# Patient Record
Sex: Male | Born: 1995 | Race: White | Hispanic: No | Marital: Single | Smoking: Never smoker
Health system: Southern US, Community
[De-identification: ages and names within clinical notes are randomized; demographics above are authoritative.]

## PROBLEM LIST (undated history)

## (undated) DIAGNOSIS — Z9109 Other allergy status, other than to drugs and biological substances: Secondary | ICD-10-CM

## (undated) DIAGNOSIS — S42302A Unspecified fracture of shaft of humerus, left arm, initial encounter for closed fracture: Secondary | ICD-10-CM

## (undated) DIAGNOSIS — L80 Vitiligo: Secondary | ICD-10-CM

## (undated) DIAGNOSIS — S82402A Unspecified fracture of shaft of left fibula, initial encounter for closed fracture: Secondary | ICD-10-CM

## (undated) DIAGNOSIS — R278 Other lack of coordination: Secondary | ICD-10-CM

## (undated) DIAGNOSIS — S42002A Fracture of unspecified part of left clavicle, initial encounter for closed fracture: Secondary | ICD-10-CM

## (undated) DIAGNOSIS — S86009A Unspecified injury of unspecified Achilles tendon, initial encounter: Secondary | ICD-10-CM

## (undated) DIAGNOSIS — R488 Other symbolic dysfunctions: Secondary | ICD-10-CM

## (undated) HISTORY — DX: Unspecified fracture of shaft of left fibula, initial encounter for closed fracture: S82.402A

## (undated) HISTORY — DX: Other lack of coordination: R27.8

## (undated) HISTORY — DX: Other symbolic dysfunctions: R48.8

## (undated) HISTORY — DX: Unspecified injury of unspecified achilles tendon, initial encounter: S86.009A

## (undated) HISTORY — PX: OTHER SURGICAL HISTORY: SHX169

## (undated) HISTORY — DX: Fracture of unspecified part of left clavicle, initial encounter for closed fracture: S42.002A

## (undated) HISTORY — DX: Other allergy status, other than to drugs and biological substances: Z91.09

## (undated) HISTORY — DX: Vitiligo: L80

## (undated) HISTORY — DX: Unspecified fracture of shaft of humerus, left arm, initial encounter for closed fracture: S42.302A

---
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
## 2017-09-11 DIAGNOSIS — B070 Plantar wart: Secondary | ICD-10-CM | POA: Diagnosis not present

## 2017-10-18 DIAGNOSIS — L723 Sebaceous cyst: Secondary | ICD-10-CM | POA: Diagnosis not present

## 2017-11-07 DIAGNOSIS — B078 Other viral warts: Secondary | ICD-10-CM | POA: Diagnosis not present

## 2017-11-24 ENCOUNTER — Telehealth: Payer: Self-pay | Admitting: Family Medicine

## 2017-11-24 NOTE — Telephone Encounter (Signed)
Okay with me  thanks 

## 2017-11-24 NOTE — Telephone Encounter (Signed)
Copied from CRM [id number]. Topic: Appointment Scheduling - Scheduling Inquiry for Clinic >> Nov 24, 2017  3:28 PM Harlan StainsWilliams-Neal, Sade R wrote: Pt is wanting to Transfer to Dr [facility]

## 2018-03-23 DIAGNOSIS — M24419 Recurrent dislocation, unspecified shoulder: Secondary | ICD-10-CM | POA: Diagnosis not present

## 2018-03-23 DIAGNOSIS — M25511 Pain in right shoulder: Secondary | ICD-10-CM | POA: Diagnosis not present

## 2018-03-26 ENCOUNTER — Encounter: Payer: Self-pay | Admitting: Family Medicine

## 2018-03-26 ENCOUNTER — Ambulatory Visit (INDEPENDENT_AMBULATORY_CARE_PROVIDER_SITE_OTHER): Payer: BLUE CROSS/BLUE SHIELD | Admitting: Family Medicine

## 2018-03-26 VITALS — BP 108/82 | HR 64 | Temp 98.2°F | Ht 74.0 in | Wt 184.2 lb

## 2018-03-26 DIAGNOSIS — L80 Vitiligo: Secondary | ICD-10-CM

## 2018-03-26 DIAGNOSIS — Z13220 Encounter for screening for lipoid disorders: Secondary | ICD-10-CM | POA: Diagnosis not present

## 2018-03-26 DIAGNOSIS — Z0000 Encounter for general adult medical examination without abnormal findings: Secondary | ICD-10-CM

## 2018-03-26 DIAGNOSIS — M25511 Pain in right shoulder: Secondary | ICD-10-CM

## 2018-03-26 DIAGNOSIS — R278 Other lack of coordination: Secondary | ICD-10-CM | POA: Insufficient documentation

## 2018-03-26 DIAGNOSIS — J301 Allergic rhinitis due to pollen: Secondary | ICD-10-CM

## 2018-03-26 LAB — COMPREHENSIVE METABOLIC PANEL
ALBUMIN: 4.7 g/dL (ref 3.5–5.2)
ALT: 15 U/L (ref 0–53)
AST: 16 U/L (ref 0–37)
Alkaline Phosphatase: 40 U/L (ref 39–117)
BUN: 15 mg/dL (ref 6–23)
CO2: 28 mEq/L (ref 19–32)
Calcium: 9.7 mg/dL (ref 8.4–10.5)
Chloride: 104 mEq/L (ref 96–112)
Creatinine, Ser: 1.07 mg/dL (ref 0.40–1.50)
GFR: 92.81 mL/min (ref >60.00)
Glucose, Bld: 91 mg/dL (ref 70–99)
POTASSIUM: 4.2 meq/L (ref 3.5–5.1)
Sodium: 139 mEq/L (ref 135–145)
Total Bilirubin: 1.1 mg/dL (ref 0.2–1.2)
Total Protein: 6.7 g/dL (ref 6.0–8.3)

## 2018-03-26 LAB — CBC
HEMATOCRIT: 45.6 % (ref 39.0–52.0)
HEMOGLOBIN: 15.7 g/dL (ref 13.0–17.0)
MCHC: 34.4 g/dL (ref 30.0–36.0)
MCV: 88.8 fl (ref 78.0–100.0)
Platelets: 198 10*3/uL (ref 150.0–400.0)
RBC: 5.14 Mil/uL (ref 4.22–5.81)
RDW: 12.8 % (ref 11.5–14.6)
WBC: 5.2 10*3/uL (ref 4.5–10.5)

## 2018-03-26 LAB — LIPID PANEL
CHOLESTEROL: 211 mg/dL — AB (ref 0–200)
HDL: 59.7 mg/dL (ref >39.00)
LDL Cholesterol: 130 mg/dL — ABNORMAL HIGH (ref 0–99)
NonHDL: 151.13
Total CHOL/HDL Ratio: 4
Triglycerides: 108 mg/dL (ref 0.0–149.0)
VLDL: 21.6 mg/dL (ref 0.0–40.0)
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
## 2018-03-26 NOTE — Patient Instructions (Addendum)
Please stop by lab before you go  No changes today- you are doing great!

## 2018-03-30 DIAGNOSIS — M25511 Pain in right shoulder: Secondary | ICD-10-CM | POA: Diagnosis not present

## 2018-04-09 DIAGNOSIS — M25511 Pain in right shoulder: Secondary | ICD-10-CM | POA: Diagnosis not present

## 2018-04-13 DIAGNOSIS — M25311 Other instability, right shoulder: Secondary | ICD-10-CM | POA: Diagnosis not present

## 2018-04-13 DIAGNOSIS — M25511 Pain in right shoulder: Secondary | ICD-10-CM | POA: Diagnosis not present

## 2018-04-16 DIAGNOSIS — M25511 Pain in right shoulder: Secondary | ICD-10-CM | POA: Diagnosis not present

## 2018-04-16 DIAGNOSIS — M25311 Other instability, right shoulder: Secondary | ICD-10-CM | POA: Diagnosis not present

## 2018-04-19 DIAGNOSIS — M25311 Other instability, right shoulder: Secondary | ICD-10-CM | POA: Diagnosis not present

## 2018-04-19 DIAGNOSIS — M25511 Pain in right shoulder: Secondary | ICD-10-CM | POA: Diagnosis not present

## 2018-09-10 DIAGNOSIS — M25311 Other instability, right shoulder: Secondary | ICD-10-CM | POA: Diagnosis not present

## 2018-09-10 DIAGNOSIS — M25511 Pain in right shoulder: Secondary | ICD-10-CM | POA: Diagnosis not present

## 2018-09-18 ENCOUNTER — Encounter: Payer: Self-pay | Admitting: Physical Therapy

## 2018-09-19 DIAGNOSIS — M25511 Pain in right shoulder: Secondary | ICD-10-CM | POA: Diagnosis not present

## 2018-09-19 DIAGNOSIS — M25311 Other instability, right shoulder: Secondary | ICD-10-CM | POA: Diagnosis not present

## 2018-09-21 DIAGNOSIS — I9789 Other postprocedural complications and disorders of the circulatory system, not elsewhere classified: Secondary | ICD-10-CM | POA: Diagnosis not present

## 2018-09-21 DIAGNOSIS — M25511 Pain in right shoulder: Secondary | ICD-10-CM | POA: Diagnosis not present

## 2018-09-21 DIAGNOSIS — Z4889 Encounter for other specified surgical aftercare: Secondary | ICD-10-CM | POA: Diagnosis not present

## 2018-09-21 DIAGNOSIS — G8918 Other acute postprocedural pain: Secondary | ICD-10-CM | POA: Diagnosis not present

## 2018-09-21 DIAGNOSIS — M24811 Other specific joint derangements of right shoulder, not elsewhere classified: Secondary | ICD-10-CM | POA: Diagnosis not present

## 2018-09-21 DIAGNOSIS — M25311 Other instability, right shoulder: Secondary | ICD-10-CM | POA: Diagnosis not present

## 2018-09-21 HISTORY — PX: SHOULDER SURGERY: SHX246

## 2018-09-22 DIAGNOSIS — I9789 Other postprocedural complications and disorders of the circulatory system, not elsewhere classified: Secondary | ICD-10-CM | POA: Diagnosis not present

## 2018-09-22 DIAGNOSIS — Z4889 Encounter for other specified surgical aftercare: Secondary | ICD-10-CM | POA: Diagnosis not present

## 2018-09-22 DIAGNOSIS — M25311 Other instability, right shoulder: Secondary | ICD-10-CM | POA: Diagnosis not present

## 2018-09-23 DIAGNOSIS — Z4889 Encounter for other specified surgical aftercare: Secondary | ICD-10-CM | POA: Diagnosis not present

## 2018-09-23 DIAGNOSIS — I9789 Other postprocedural complications and disorders of the circulatory system, not elsewhere classified: Secondary | ICD-10-CM | POA: Diagnosis not present

## 2018-09-23 DIAGNOSIS — M25311 Other instability, right shoulder: Secondary | ICD-10-CM | POA: Diagnosis not present

## 2018-09-24 DIAGNOSIS — Z4889 Encounter for other specified surgical aftercare: Secondary | ICD-10-CM | POA: Diagnosis not present

## 2018-09-24 DIAGNOSIS — I9789 Other postprocedural complications and disorders of the circulatory system, not elsewhere classified: Secondary | ICD-10-CM | POA: Diagnosis not present

## 2018-09-24 DIAGNOSIS — M25311 Other instability, right shoulder: Secondary | ICD-10-CM | POA: Diagnosis not present

## 2018-09-25 DIAGNOSIS — Z4889 Encounter for other specified surgical aftercare: Secondary | ICD-10-CM | POA: Diagnosis not present

## 2018-09-25 DIAGNOSIS — I9789 Other postprocedural complications and disorders of the circulatory system, not elsewhere classified: Secondary | ICD-10-CM | POA: Diagnosis not present

## 2018-09-25 DIAGNOSIS — M25311 Other instability, right shoulder: Secondary | ICD-10-CM | POA: Diagnosis not present

## 2018-09-26 DIAGNOSIS — Z4889 Encounter for other specified surgical aftercare: Secondary | ICD-10-CM | POA: Diagnosis not present

## 2018-09-26 DIAGNOSIS — I9789 Other postprocedural complications and disorders of the circulatory system, not elsewhere classified: Secondary | ICD-10-CM | POA: Diagnosis not present

## 2018-09-26 DIAGNOSIS — M25311 Other instability, right shoulder: Secondary | ICD-10-CM | POA: Diagnosis not present

## 2018-09-27 DIAGNOSIS — M25311 Other instability, right shoulder: Secondary | ICD-10-CM | POA: Diagnosis not present

## 2018-09-27 DIAGNOSIS — Z4889 Encounter for other specified surgical aftercare: Secondary | ICD-10-CM | POA: Diagnosis not present

## 2018-09-27 DIAGNOSIS — I9789 Other postprocedural complications and disorders of the circulatory system, not elsewhere classified: Secondary | ICD-10-CM | POA: Diagnosis not present

## 2018-09-28 DIAGNOSIS — I9789 Other postprocedural complications and disorders of the circulatory system, not elsewhere classified: Secondary | ICD-10-CM | POA: Diagnosis not present

## 2018-09-28 DIAGNOSIS — M25311 Other instability, right shoulder: Secondary | ICD-10-CM | POA: Diagnosis not present

## 2018-09-28 DIAGNOSIS — Z4889 Encounter for other specified surgical aftercare: Secondary | ICD-10-CM | POA: Diagnosis not present

## 2018-09-29 DIAGNOSIS — Z4889 Encounter for other specified surgical aftercare: Secondary | ICD-10-CM | POA: Diagnosis not present

## 2018-09-29 DIAGNOSIS — M25311 Other instability, right shoulder: Secondary | ICD-10-CM | POA: Diagnosis not present

## 2018-09-29 DIAGNOSIS — I9789 Other postprocedural complications and disorders of the circulatory system, not elsewhere classified: Secondary | ICD-10-CM | POA: Diagnosis not present

## 2018-09-30 DIAGNOSIS — M25311 Other instability, right shoulder: Secondary | ICD-10-CM | POA: Diagnosis not present

## 2018-09-30 DIAGNOSIS — I9789 Other postprocedural complications and disorders of the circulatory system, not elsewhere classified: Secondary | ICD-10-CM | POA: Diagnosis not present

## 2018-09-30 DIAGNOSIS — Z4889 Encounter for other specified surgical aftercare: Secondary | ICD-10-CM | POA: Diagnosis not present

## 2018-10-01 DIAGNOSIS — M25311 Other instability, right shoulder: Secondary | ICD-10-CM | POA: Diagnosis not present

## 2018-10-01 DIAGNOSIS — Z4889 Encounter for other specified surgical aftercare: Secondary | ICD-10-CM | POA: Diagnosis not present

## 2018-10-01 DIAGNOSIS — I9789 Other postprocedural complications and disorders of the circulatory system, not elsewhere classified: Secondary | ICD-10-CM | POA: Diagnosis not present

## 2018-10-02 DIAGNOSIS — M25311 Other instability, right shoulder: Secondary | ICD-10-CM | POA: Diagnosis not present

## 2018-10-02 DIAGNOSIS — I9789 Other postprocedural complications and disorders of the circulatory system, not elsewhere classified: Secondary | ICD-10-CM | POA: Diagnosis not present

## 2018-10-02 DIAGNOSIS — Z4889 Encounter for other specified surgical aftercare: Secondary | ICD-10-CM | POA: Diagnosis not present

## 2018-10-03 DIAGNOSIS — Z4889 Encounter for other specified surgical aftercare: Secondary | ICD-10-CM | POA: Diagnosis not present

## 2018-10-03 DIAGNOSIS — M25311 Other instability, right shoulder: Secondary | ICD-10-CM | POA: Diagnosis not present

## 2018-10-03 DIAGNOSIS — I9789 Other postprocedural complications and disorders of the circulatory system, not elsewhere classified: Secondary | ICD-10-CM | POA: Diagnosis not present

## 2018-10-04 DIAGNOSIS — I9789 Other postprocedural complications and disorders of the circulatory system, not elsewhere classified: Secondary | ICD-10-CM | POA: Diagnosis not present

## 2018-10-04 DIAGNOSIS — M25311 Other instability, right shoulder: Secondary | ICD-10-CM | POA: Diagnosis not present

## 2018-10-04 DIAGNOSIS — Z4889 Encounter for other specified surgical aftercare: Secondary | ICD-10-CM | POA: Diagnosis not present

## 2018-10-04 DIAGNOSIS — M25511 Pain in right shoulder: Secondary | ICD-10-CM | POA: Diagnosis not present

## 2018-10-05 DIAGNOSIS — M25511 Pain in right shoulder: Secondary | ICD-10-CM | POA: Diagnosis not present

## 2018-10-05 DIAGNOSIS — I9789 Other postprocedural complications and disorders of the circulatory system, not elsewhere classified: Secondary | ICD-10-CM | POA: Diagnosis not present

## 2018-10-05 DIAGNOSIS — M25311 Other instability, right shoulder: Secondary | ICD-10-CM | POA: Diagnosis not present

## 2018-10-05 DIAGNOSIS — Z4889 Encounter for other specified surgical aftercare: Secondary | ICD-10-CM | POA: Diagnosis not present

## 2018-10-06 DIAGNOSIS — I9789 Other postprocedural complications and disorders of the circulatory system, not elsewhere classified: Secondary | ICD-10-CM | POA: Diagnosis not present

## 2018-10-06 DIAGNOSIS — M25311 Other instability, right shoulder: Secondary | ICD-10-CM | POA: Diagnosis not present

## 2018-10-06 DIAGNOSIS — Z4889 Encounter for other specified surgical aftercare: Secondary | ICD-10-CM | POA: Diagnosis not present

## 2018-10-07 DIAGNOSIS — Z4889 Encounter for other specified surgical aftercare: Secondary | ICD-10-CM | POA: Diagnosis not present

## 2018-10-07 DIAGNOSIS — M25311 Other instability, right shoulder: Secondary | ICD-10-CM | POA: Diagnosis not present

## 2018-10-07 DIAGNOSIS — I9789 Other postprocedural complications and disorders of the circulatory system, not elsewhere classified: Secondary | ICD-10-CM | POA: Diagnosis not present

## 2018-10-08 DIAGNOSIS — M25311 Other instability, right shoulder: Secondary | ICD-10-CM | POA: Diagnosis not present

## 2018-10-08 DIAGNOSIS — I9789 Other postprocedural complications and disorders of the circulatory system, not elsewhere classified: Secondary | ICD-10-CM | POA: Diagnosis not present

## 2018-10-08 DIAGNOSIS — Z4889 Encounter for other specified surgical aftercare: Secondary | ICD-10-CM | POA: Diagnosis not present

## 2018-10-09 DIAGNOSIS — M25311 Other instability, right shoulder: Secondary | ICD-10-CM | POA: Diagnosis not present

## 2018-10-09 DIAGNOSIS — Z4889 Encounter for other specified surgical aftercare: Secondary | ICD-10-CM | POA: Diagnosis not present

## 2018-10-09 DIAGNOSIS — I9789 Other postprocedural complications and disorders of the circulatory system, not elsewhere classified: Secondary | ICD-10-CM | POA: Diagnosis not present

## 2018-10-10 DIAGNOSIS — I9789 Other postprocedural complications and disorders of the circulatory system, not elsewhere classified: Secondary | ICD-10-CM | POA: Diagnosis not present

## 2018-10-10 DIAGNOSIS — Z4889 Encounter for other specified surgical aftercare: Secondary | ICD-10-CM | POA: Diagnosis not present

## 2018-10-10 DIAGNOSIS — M25311 Other instability, right shoulder: Secondary | ICD-10-CM | POA: Diagnosis not present

## 2018-10-11 DIAGNOSIS — I9789 Other postprocedural complications and disorders of the circulatory system, not elsewhere classified: Secondary | ICD-10-CM | POA: Diagnosis not present

## 2018-10-11 DIAGNOSIS — Z4889 Encounter for other specified surgical aftercare: Secondary | ICD-10-CM | POA: Diagnosis not present

## 2018-10-11 DIAGNOSIS — M25311 Other instability, right shoulder: Secondary | ICD-10-CM | POA: Diagnosis not present

## 2018-10-12 DIAGNOSIS — M25311 Other instability, right shoulder: Secondary | ICD-10-CM | POA: Diagnosis not present

## 2018-10-12 DIAGNOSIS — I9789 Other postprocedural complications and disorders of the circulatory system, not elsewhere classified: Secondary | ICD-10-CM | POA: Diagnosis not present

## 2018-10-12 DIAGNOSIS — Z4889 Encounter for other specified surgical aftercare: Secondary | ICD-10-CM | POA: Diagnosis not present

## 2018-10-13 DIAGNOSIS — I9789 Other postprocedural complications and disorders of the circulatory system, not elsewhere classified: Secondary | ICD-10-CM | POA: Diagnosis not present

## 2018-10-13 DIAGNOSIS — M25311 Other instability, right shoulder: Secondary | ICD-10-CM | POA: Diagnosis not present

## 2018-10-13 DIAGNOSIS — Z4889 Encounter for other specified surgical aftercare: Secondary | ICD-10-CM | POA: Diagnosis not present

## 2018-10-14 DIAGNOSIS — I9789 Other postprocedural complications and disorders of the circulatory system, not elsewhere classified: Secondary | ICD-10-CM | POA: Diagnosis not present

## 2018-10-14 DIAGNOSIS — Z4889 Encounter for other specified surgical aftercare: Secondary | ICD-10-CM | POA: Diagnosis not present

## 2018-10-14 DIAGNOSIS — M25311 Other instability, right shoulder: Secondary | ICD-10-CM | POA: Diagnosis not present

## 2018-10-15 DIAGNOSIS — M25311 Other instability, right shoulder: Secondary | ICD-10-CM | POA: Diagnosis not present

## 2018-10-15 DIAGNOSIS — Z4889 Encounter for other specified surgical aftercare: Secondary | ICD-10-CM | POA: Diagnosis not present

## 2018-10-15 DIAGNOSIS — I9789 Other postprocedural complications and disorders of the circulatory system, not elsewhere classified: Secondary | ICD-10-CM | POA: Diagnosis not present

## 2018-11-05 DIAGNOSIS — M25511 Pain in right shoulder: Secondary | ICD-10-CM | POA: Diagnosis not present

## 2018-11-05 DIAGNOSIS — M25311 Other instability, right shoulder: Secondary | ICD-10-CM | POA: Diagnosis not present

## 2018-11-09 DIAGNOSIS — M25511 Pain in right shoulder: Secondary | ICD-10-CM | POA: Diagnosis not present

## 2018-11-09 DIAGNOSIS — M25311 Other instability, right shoulder: Secondary | ICD-10-CM | POA: Diagnosis not present

## 2018-11-13 DIAGNOSIS — Z20828 Contact with and (suspected) exposure to other viral communicable diseases: Secondary | ICD-10-CM | POA: Diagnosis not present

## 2018-11-19 DIAGNOSIS — Z20828 Contact with and (suspected) exposure to other viral communicable diseases: Secondary | ICD-10-CM | POA: Diagnosis not present

## 2018-11-29 DIAGNOSIS — M25511 Pain in right shoulder: Secondary | ICD-10-CM | POA: Diagnosis not present

## 2018-11-29 DIAGNOSIS — Z4789 Encounter for other orthopedic aftercare: Secondary | ICD-10-CM | POA: Diagnosis not present

## 2018-12-06 DIAGNOSIS — Z4789 Encounter for other orthopedic aftercare: Secondary | ICD-10-CM | POA: Diagnosis not present

## 2018-12-06 DIAGNOSIS — M25511 Pain in right shoulder: Secondary | ICD-10-CM | POA: Diagnosis not present

## 2018-12-10 DIAGNOSIS — Z4789 Encounter for other orthopedic aftercare: Secondary | ICD-10-CM | POA: Diagnosis not present

## 2018-12-10 DIAGNOSIS — M25511 Pain in right shoulder: Secondary | ICD-10-CM | POA: Diagnosis not present

## 2018-12-13 DIAGNOSIS — M25511 Pain in right shoulder: Secondary | ICD-10-CM | POA: Diagnosis not present

## 2018-12-13 DIAGNOSIS — Z4789 Encounter for other orthopedic aftercare: Secondary | ICD-10-CM | POA: Diagnosis not present

## 2018-12-18 DIAGNOSIS — Z4789 Encounter for other orthopedic aftercare: Secondary | ICD-10-CM | POA: Diagnosis not present

## 2018-12-18 DIAGNOSIS — M25511 Pain in right shoulder: Secondary | ICD-10-CM | POA: Diagnosis not present

## 2018-12-21 DIAGNOSIS — Z4789 Encounter for other orthopedic aftercare: Secondary | ICD-10-CM | POA: Diagnosis not present

## 2018-12-21 DIAGNOSIS — M25511 Pain in right shoulder: Secondary | ICD-10-CM | POA: Diagnosis not present

## 2018-12-24 DIAGNOSIS — M25511 Pain in right shoulder: Secondary | ICD-10-CM | POA: Diagnosis not present

## 2018-12-24 DIAGNOSIS — Z4789 Encounter for other orthopedic aftercare: Secondary | ICD-10-CM | POA: Diagnosis not present

## 2018-12-28 DIAGNOSIS — Z4789 Encounter for other orthopedic aftercare: Secondary | ICD-10-CM | POA: Diagnosis not present

## 2018-12-28 DIAGNOSIS — M25511 Pain in right shoulder: Secondary | ICD-10-CM | POA: Diagnosis not present

## 2019-01-04 DIAGNOSIS — Z4789 Encounter for other orthopedic aftercare: Secondary | ICD-10-CM | POA: Diagnosis not present

## 2019-01-04 DIAGNOSIS — M25511 Pain in right shoulder: Secondary | ICD-10-CM | POA: Diagnosis not present

## 2019-01-07 DIAGNOSIS — M25511 Pain in right shoulder: Secondary | ICD-10-CM | POA: Diagnosis not present

## 2019-01-07 DIAGNOSIS — Z4789 Encounter for other orthopedic aftercare: Secondary | ICD-10-CM | POA: Diagnosis not present

## 2019-01-10 DIAGNOSIS — Z4789 Encounter for other orthopedic aftercare: Secondary | ICD-10-CM | POA: Diagnosis not present

## 2019-01-10 DIAGNOSIS — M25511 Pain in right shoulder: Secondary | ICD-10-CM | POA: Diagnosis not present

## 2019-01-15 DIAGNOSIS — M25511 Pain in right shoulder: Secondary | ICD-10-CM | POA: Diagnosis not present

## 2019-01-15 DIAGNOSIS — Z4789 Encounter for other orthopedic aftercare: Secondary | ICD-10-CM | POA: Diagnosis not present

## 2019-01-18 DIAGNOSIS — Z20828 Contact with and (suspected) exposure to other viral communicable diseases: Secondary | ICD-10-CM | POA: Diagnosis not present

## 2019-01-18 DIAGNOSIS — Z4789 Encounter for other orthopedic aftercare: Secondary | ICD-10-CM | POA: Diagnosis not present

## 2019-01-18 DIAGNOSIS — M25511 Pain in right shoulder: Secondary | ICD-10-CM | POA: Diagnosis not present

## 2019-01-22 DIAGNOSIS — M25511 Pain in right shoulder: Secondary | ICD-10-CM | POA: Diagnosis not present

## 2019-01-22 DIAGNOSIS — Z4789 Encounter for other orthopedic aftercare: Secondary | ICD-10-CM | POA: Diagnosis not present

## 2019-01-31 DIAGNOSIS — M25511 Pain in right shoulder: Secondary | ICD-10-CM | POA: Diagnosis not present

## 2019-01-31 DIAGNOSIS — Z4789 Encounter for other orthopedic aftercare: Secondary | ICD-10-CM | POA: Diagnosis not present

## 2019-02-15 DIAGNOSIS — Z4789 Encounter for other orthopedic aftercare: Secondary | ICD-10-CM | POA: Diagnosis not present

## 2019-02-15 DIAGNOSIS — M25511 Pain in right shoulder: Secondary | ICD-10-CM | POA: Diagnosis not present

## 2019-02-21 DIAGNOSIS — Z4789 Encounter for other orthopedic aftercare: Secondary | ICD-10-CM | POA: Diagnosis not present

## 2019-02-21 DIAGNOSIS — M25511 Pain in right shoulder: Secondary | ICD-10-CM | POA: Diagnosis not present

## 2019-02-28 DIAGNOSIS — M25511 Pain in right shoulder: Secondary | ICD-10-CM | POA: Diagnosis not present

## 2019-02-28 DIAGNOSIS — Z4789 Encounter for other orthopedic aftercare: Secondary | ICD-10-CM | POA: Diagnosis not present

## 2019-03-29 ENCOUNTER — Encounter: Payer: Self-pay | Admitting: Family Medicine

## 2019-03-29 ENCOUNTER — Ambulatory Visit (INDEPENDENT_AMBULATORY_CARE_PROVIDER_SITE_OTHER): Payer: BC Managed Care – PPO | Admitting: Family Medicine

## 2019-03-29 ENCOUNTER — Other Ambulatory Visit: Payer: Self-pay

## 2019-03-29 VITALS — BP 108/82 | HR 71 | Temp 98.6°F | Ht 74.0 in | Wt 198.6 lb

## 2019-03-29 DIAGNOSIS — Z1321 Encounter for screening for nutritional disorder: Secondary | ICD-10-CM

## 2019-03-29 DIAGNOSIS — Z23 Encounter for immunization: Secondary | ICD-10-CM | POA: Diagnosis not present

## 2019-03-29 DIAGNOSIS — L80 Vitiligo: Secondary | ICD-10-CM | POA: Diagnosis not present

## 2019-03-29 DIAGNOSIS — Z0000 Encounter for general adult medical examination without abnormal findings: Secondary | ICD-10-CM | POA: Diagnosis not present

## 2019-03-29 DIAGNOSIS — E785 Hyperlipidemia, unspecified: Secondary | ICD-10-CM | POA: Diagnosis not present

## 2019-03-29 DIAGNOSIS — Z114 Encounter for screening for human immunodeficiency virus [HIV]: Secondary | ICD-10-CM | POA: Diagnosis not present

## 2019-03-29 DIAGNOSIS — Z4789 Encounter for other orthopedic aftercare: Secondary | ICD-10-CM | POA: Diagnosis not present

## 2019-03-29 LAB — COMPREHENSIVE METABOLIC PANEL
ALT: 17 U/L (ref 0–53)
AST: 16 U/L (ref 0–37)
Albumin: 4.7 g/dL (ref 3.5–5.2)
Alkaline Phosphatase: 39 U/L (ref 39–117)
BUN: 20 mg/dL (ref 6–23)
CO2: 26 mEq/L (ref 19–32)
Calcium: 9.6 mg/dL (ref 8.4–10.5)
Chloride: 106 mEq/L (ref 96–112)
Creatinine, Ser: 1.1 mg/dL (ref 0.40–1.50)
GFR: 83.77 mL/min (ref >60.00)
Glucose, Bld: 96 mg/dL (ref 70–99)
Potassium: 4.2 mEq/L (ref 3.5–5.1)
Sodium: 139 mEq/L (ref 135–145)
Total Bilirubin: 0.8 mg/dL (ref 0.2–1.2)
Total Protein: 6.6 g/dL (ref 6.0–8.3)

## 2019-03-29 LAB — CBC WITH DIFFERENTIAL/PLATELET
Basophils Absolute: 0 10*3/uL (ref 0.0–0.1)
Basophils Relative: 0.6 % (ref 0.0–3.0)
Eosinophils Absolute: 0.3 10*3/uL (ref 0.0–0.7)
Eosinophils Relative: 5.1 % — ABNORMAL HIGH (ref 0.0–5.0)
HCT: 45.1 % (ref 39.0–52.0)
Hemoglobin: 15.4 g/dL (ref 13.0–17.0)
Lymphocytes Relative: 27.4 % (ref 12.0–46.0)
Lymphs Abs: 1.5 10*3/uL (ref 0.7–4.0)
MCHC: 34.2 g/dL (ref 30.0–36.0)
MCV: 89 fl (ref 78.0–100.0)
Monocytes Absolute: 0.5 10*3/uL (ref 0.1–1.0)
Monocytes Relative: 8.4 % (ref 3.0–12.0)
Neutro Abs: 3.3 10*3/uL (ref 1.4–7.7)
Neutrophils Relative %: 58.5 % (ref 43.0–77.0)
Platelets: 235 10*3/uL (ref 150.0–400.0)
RBC: 5.06 Mil/uL (ref 4.22–5.81)
RDW: 12.9 % (ref 11.5–15.5)
WBC: 5.6 10*3/uL (ref 4.0–10.5)

## 2019-03-29 LAB — LIPID PANEL
Cholesterol: 244 mg/dL — ABNORMAL HIGH (ref 0–200)
HDL: 49.2 mg/dL (ref >39.00)
NonHDL: 194.6
Total CHOL/HDL Ratio: 5
Triglycerides: 221 mg/dL — ABNORMAL HIGH (ref 0.0–149.0)
VLDL: 44.2 mg/dL — ABNORMAL HIGH (ref 0.0–40.0)

## 2019-03-29 LAB — LDL CHOLESTEROL, DIRECT: Direct LDL: 150 mg/dL

## 2019-03-29 LAB — VITAMIN D 25 HYDROXY (VIT D DEFICIENCY, FRACTURES): VITD: 9.97 ng/mL — ABNORMAL LOW (ref 30.00–100.00)
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
## 2019-03-29 NOTE — Addendum Note (Signed)
Addended by: Francella Solian on: 03/29/2019 10:17 AM   Modules accepted: Orders

## 2019-03-29 NOTE — Patient Instructions (Addendum)
Health Maintenance Due  Topic Date Due  . HIV Screening will have today  04/28/2012  . INFLUENZA VACCINE will have today  11/10/2018   Please stop by lab before you go If you do not have mychart- we will call you about results within 5 business days of [place] receiving them.  If you have mychart- we will send your results within 3 business days of [place] receiving them.  If abnormal or we want to clarify a result, we will call or mychart you to make sure you receive the message.  If you have questions or concerns or don't hear within 5-7 days, please send [place] a message or call us.   Recommended follow up: Return in about 1 year (around 03/28/2020) for CPE.     Marland Kitchen.It takes about 2 weeks for protection to develop after vaccination.  There are many flu viruses, and they are always changing. Each year a new flu vaccine is made to protect against three or four viruses that are likely to cause disease in the upcoming flu season. Even when the vaccine doesn't exactly match these viruses, it may still provide some protection.   Influenza vaccine does not cause flu.  Influenza vaccine may be given at the same time as other vaccines.  3. Talk with your health care provider  Tell your vaccine provider if the person getting the vaccine: ; Has had an allergic reaction after a previous dose of influenza vaccine, or has any severe, life-threatening allergies.  ; Has ever had Guillain-Barr Syndrome (also called GBS).  In some cases, your health care provider may decide to postpone influenza vaccination to a future visit.  People with minor illnesses, such as a cold, may be vaccinated. People who are moderately or severely ill should usually wait until they recover before getting influenza vaccine.  Your health care provider can give you more information.  4. Risks of a reaction  ; Soreness, redness, and swelling where shot is given, fever, muscle aches, and headache can happen after influenza  vaccine. ; There may be a very small increased risk of Guillain-Barr Syndrome (GBS) after inactivated influenza vaccine (the flu shot).  Young children who get the flu shot along with pneumococcal vaccine (PCV13), and/or DTaP vaccine at the same time might be slightly more likely to have a seizure caused by fever. Tell your health care provider if a child who is getting flu vaccine has ever had a seizure.  People sometimes faint after medical procedures, including vaccination. Tell your provider if you feel dizzy or have vision changes or ringing in the ears.  As with any medicine, there is a very remote chance of a vaccine causing a severe allergic reaction, other serious injury, or death.  5. What if there is a serious problem?  An allergic reaction could occur after the vaccinated person leaves the clinic. If you see signs of a severe allergic reaction (hives, swelling of the face and throat, difficulty breathing, a fast heartbeat, dizziness, or weakness), call 9-1-1 and get the person to the nearest hospital.  For other signs that concern you, call your health care provider.  Adverse reactions should be reported to the Vaccine Adverse Event Reporting System (VAERS). Your health care provider will usually file this report, or you can do it yourself. Visit the VAERS website at www.vaers.SamedayNews.es or call 6696160716.  VAERS is only for reporting reactions, and VAERS staff do not give medical advice.  6. The National Vaccine Injury Fiserv  The  National Scientist, forensic (VICP) is a Stage manager that was created to compensate people who may have been injured by certain vaccines. Visit the VICP website at SpiritualWord.at or call 714-872-2669 to learn about the program and about filing a claim. There is a time limit to file a claim for compensation.  7. How can I learn more?  ; Ask your health care provider.  ; Call your local or state  health department. ; Contact the Centers for Disease Control and Prevention (CDC): - Call 6312596300 (1-800-CDC-INFO) or - Visit CDC's influenza website at BiotechRoom.com.cy  Vaccine Information Statement (Interim) Inactivated Influenza Vaccine  11/23/2017 42 U.S.C.  [phone]   Department of Health and Advertising copywriter for Disease Control and Prevention  Office Use Only

## 2019-03-30 LAB — HIV ANTIBODY (ROUTINE TESTING W REFLEX): HIV 1&2 Ab, 4th Generation: NONREACTIVE

## 2019-03-30 MED ORDER — VITAMIN D (ERGOCALCIFEROL) 1.25 MG (50000 UNIT) PO CAPS: 50000.0000 [IU] | ORAL_CAPSULE | ORAL | 1 refills | Status: DC

## 2019-03-30 NOTE — Addendum Note (Signed)
Addended by: Marin Olp on: 03/30/2019 09:57 AM   Modules accepted: Orders

## 2019-04-26 DIAGNOSIS — Z20828 Contact with and (suspected) exposure to other viral communicable diseases: Secondary | ICD-10-CM | POA: Diagnosis not present

## 2019-05-24 ENCOUNTER — Telehealth: Payer: Self-pay | Admitting: Family Medicine

## 2019-05-24 NOTE — Telephone Encounter (Signed)
Hi ,   There was not previously documented Vitamin D deficiency prior to this lab documented.  This was ordered as a screening and was dx after lab test with Vitamin D deficiency.  Looks like Dr. Durene Cal made them aware that could be charge.  Thanks [employer]

## 2019-05-24 NOTE — Telephone Encounter (Signed)
Pt mother called stating she received an $53 bill from [facility] for a Vitamin D blood test. Pt mother called Billing Department and was told the the test was charged due to not being a medical necessity. Pt mother states this is incorrect due to pt having low vitamin D counts and was prescribed medication to help improve the counts. Mother is requesting for the code to be re-processed as a medical necessity so insurance will cover the charge. Please advise.

## 2019-05-24 NOTE — Telephone Encounter (Signed)
Hey Dawn,   Can you review this charge and see if it is correct?  Thanks,  Clint Lipps

## 2019-05-31 NOTE — Telephone Encounter (Signed)
Left patient vm stating that coding has taken a look at this OV and has determined that proper coding had been completed.  I informed patient if he would like to give me a call back in regard that he could.

## 2019-05-31 NOTE — Telephone Encounter (Signed)
Please advise 

## 2019-07-05 ENCOUNTER — Telehealth: Payer: Self-pay | Admitting: Family Medicine

## 2019-07-05 ENCOUNTER — Encounter: Payer: Self-pay | Admitting: Family Medicine

## 2019-07-05 DIAGNOSIS — E559 Vitamin D deficiency, unspecified: Secondary | ICD-10-CM | POA: Insufficient documentation

## 2019-07-05 NOTE — Telephone Encounter (Signed)
Yes thanks had a good chat with mom and tried to explain further- how z codes sometimes are and sometimes are not covered by insurance- and how this was a recent change in recent years and I am genuinely sorry. I do remember he was having some fatigue and I offered to document under that but she declined.   No further follow up needed at this time

## 2019-07-05 NOTE — Telephone Encounter (Signed)
  I had a very lengthy conversation with the mother of this patient about her concern over her son's vitamin D lab that was completed on 03/29/19. It was not covered due to not being medically necessary. She stated that it was medically necessary due to the patient being fatigued, pale and that it post surgery. She also stated that it should have been checked because he had multiple fractures and had been on vitamin D in the past. She felt that you did not make a good decision in his care due to  Not realizing it needed to be checked and feels like she should not have had to ask for this to be done or to be coded correctly. I let her know that I would have you review it but that it was your medical decision on whether it needed to be ordered. She then proceeded to inform me that she was a first responder and also as a mother that she knew what her son needed more than anyone. She was very argumentative and had many issues with our clinic including a few times that her appointment had been reschedule by another provider and how telephone call was handled. I tried to explain why her appointments were rescheduled and let her know that I appreciated the feedback on the staff member. She stated that I had an excuse for every issue that she had and that I was not listening. I tried to explain that I was listening and trying to offer a reason. She did not want to hear any of that and said that she would take it further up the chain. I again thanked her for giving me feedback and offered to get her in touch with someone above me. She declined and hung up.

## 2019-07-08 NOTE — Telephone Encounter (Signed)
erro  neous encounter

## 2019-11-18 ENCOUNTER — Ambulatory Visit: Payer: BC Managed Care – PPO | Admitting: Podiatry

## 2019-11-18 ENCOUNTER — Ambulatory Visit (INDEPENDENT_AMBULATORY_CARE_PROVIDER_SITE_OTHER): Payer: BC Managed Care – PPO

## 2019-11-18 ENCOUNTER — Other Ambulatory Visit: Payer: Self-pay | Admitting: Podiatry

## 2019-11-18 ENCOUNTER — Other Ambulatory Visit: Payer: Self-pay

## 2019-11-18 ENCOUNTER — Encounter: Payer: Self-pay | Admitting: Podiatry

## 2019-11-18 VITALS — Temp 98.1°F

## 2019-11-18 DIAGNOSIS — M7661 Achilles tendinitis, right leg: Secondary | ICD-10-CM

## 2019-11-18 DIAGNOSIS — L600 Ingrowing nail: Secondary | ICD-10-CM | POA: Diagnosis not present

## 2019-11-18 DIAGNOSIS — M79672 Pain in left foot: Secondary | ICD-10-CM

## 2019-11-18 DIAGNOSIS — M779 Enthesopathy, unspecified: Secondary | ICD-10-CM

## 2019-11-18 MED ORDER — NEOMYCIN-POLYMYXIN-HC 3.5-10000-1 OT SOLN: OTIC | 0 refills | Status: DC

## 2019-11-18 NOTE — Patient Instructions (Addendum)
Achilles Tendinitis  with Rehab Achilles tendinitis is a disorder of the Achilles tendon. The Achilles tendon connects the large calf muscles (Gastrocnemius and Soleus) to the heel bone (calcaneus). This tendon is sometimes called the heel cord. It is important for pushing-off and standing on your toes and is important for walking, running, or jumping. Tendinitis is often caused by overuse and repetitive microtrauma. SYMPTOMS  Pain, tenderness, swelling, warmth, and redness may occur over the Achilles tendon even at rest.  Pain with pushing off, or flexing or extending the ankle.  Pain that is worsened after or during activity. CAUSES   Overuse sometimes seen with rapid increase in exercise programs or in sports requiring running and jumping.  Poor physical conditioning (strength and flexibility or endurance).  Running sports, especially training running down hills.  Inadequate warm-up before practice or play or failure to stretch before participation.  Injury to the tendon. PREVENTION   Warm up and stretch before practice or competition.  Allow time for adequate rest and recovery between practices and competition.  Keep up conditioning.  Keep up ankle and leg flexibility.  Improve or keep muscle strength and endurance.  Improve cardiovascular fitness.  Use proper technique.  Use proper equipment (shoes, skates).  To help prevent recurrence, taping, protective strapping, or an adhesive bandage may be recommended for several weeks after healing is complete. PROGNOSIS   Recovery may take weeks to several months to heal.  Longer recovery is expected if symptoms have been prolonged.  Recovery is usually quicker if the inflammation is due to a direct blow as compared with overuse or sudden strain. RELATED COMPLICATIONS   Healing time will be prolonged if the condition is not correctly treated. The injury must be given plenty of time to heal.  Symptoms can reoccur if  activity is resumed too soon.  Untreated, tendinitis may increase the risk of tendon rupture requiring additional time for recovery and possibly surgery. TREATMENT   The first treatment consists of rest anti-inflammatory medication, and ice to relieve the pain.  Stretching and strengthening exercises after resolution of pain will likely help reduce the risk of recurrence. Referral to a physical therapist or athletic trainer for further evaluation and treatment may be helpful.  A walking boot or cast may be recommended to rest the Achilles tendon. This can help break the cycle of inflammation and microtrauma.  Arch supports (orthotics) may be prescribed or recommended by your caregiver as an adjunct to therapy and rest.  Surgery to remove the inflamed tendon lining or degenerated tendon tissue is rarely necessary and has shown less than predictable results. MEDICATION   Nonsteroidal anti-inflammatory medications, such as aspirin and ibuprofen, may be used for pain and inflammation relief. Do not take within 7 days before surgery. Take these as directed by your caregiver. Contact your caregiver immediately if any bleeding, stomach upset, or signs of allergic reaction occur. Other minor pain relievers, such as acetaminophen, may also be used.  Pain relievers may be prescribed as necessary by your caregiver. Do not take prescription pain medication for longer than 4 to 7 days. Use only as directed and only as much as you need.  Cortisone injections are rarely indicated. Cortisone injections may weaken tendons and predispose to rupture. It is better to give the condition more time to heal than to use them. HEAT AND COLD  Cold is used to relieve pain and reduce inflammation for acute and chronic Achilles tendinitis. Cold should be applied for 10 to 15 minutes   every 2 to 3 hours for inflammation and pain and immediately after any activity that aggravates your symptoms. Use ice packs or an ice  massage.  Heat may be used before performing stretching and strengthening activities prescribed by your caregiver. Use a heat pack or a warm soak. SEEK MEDICAL CARE IF:  Symptoms get worse or do not improve in 2 weeks despite treatment.  New, unexplained symptoms develop. Drugs used in treatment may produce side effects.  EXERCISES:  RANGE OF MOTION (ROM) AND STRETCHING EXERCISES - Achilles Tendinitis  These exercises may help you when beginning to rehabilitate your injury. Your symptoms may resolve with or without further involvement from your physician, physical therapist or athletic trainer. While completing these exercises, remember:   Restoring tissue flexibility helps normal motion to return to the joints. This allows healthier, less painful movement and activity.  An effective stretch should be held for at least 30 seconds.  A stretch should never be painful. You should only feel a gentle lengthening or release in the stretched tissue.  STRETCH  Gastroc, Standing   Place hands on wall.  Extend right / left leg, keeping the front knee somewhat bent.  Slightly point your toes inward on your back foot.  Keeping your right / left heel on the floor and your knee straight, shift your weight toward the wall, not allowing your back to arch.  You should feel a gentle stretch in the right / left calf. Hold this position for 10 seconds. Repeat 3 times. Complete this stretch 2 times per day.  STRETCH  Soleus, Standing   Place hands on wall.  Extend right / left leg, keeping the other knee somewhat bent.  Slightly point your toes inward on your back foot.  Keep your right / left heel on the floor, bend your back knee, and slightly shift your weight over the back leg so that you feel a gentle stretch deep in your back calf.  Hold this position for 10 seconds. Repeat 3 times. Complete this stretch 2 times per day.  STRETCH  Gastrocsoleus, Standing  Note: This exercise can place  a lot of stress on your foot and ankle. Please complete this exercise only if specifically instructed by your caregiver.   Place the ball of your right / left foot on a step, keeping your other foot firmly on the same step.  Hold on to the wall or a rail for balance.  Slowly lift your other foot, allowing your body weight to press your heel down over the edge of the step.  You should feel a stretch in your right / left calf.  Hold this position for 10 seconds.  Repeat this exercise with a slight bend in your knee. Repeat 3 times. Complete this stretch 2 times per day.   STRENGTHENING EXERCISES - Achilles Tendinitis These exercises may help you when beginning to rehabilitate your injury. They may resolve your symptoms with or without further involvement from your physician, physical therapist or athletic trainer. While completing these exercises, remember:   Muscles can gain both the endurance and the strength needed for everyday activities through controlled exercises.  Complete these exercises as instructed by your physician, physical therapist or athletic trainer. Progress the resistance and repetitions only as guided.  You may experience muscle soreness or fatigue, but the pain or discomfort you are trying to eliminate should never worsen during these exercises. If this pain does worsen, stop and make certain you are following the directions exactly. If   the pain is still present after adjustments, discontinue the exercise until you can discuss the trouble with your clinician.  STRENGTH - Plantar-flexors   Sit with your right / left leg extended. Holding onto both ends of a rubber exercise band/tubing, loop it around the ball of your foot. Keep a slight tension in the band.  Slowly push your toes away from you, pointing them downward.  Hold this position for 10 seconds. Return slowly, controlling the tension in the band/tubing. Repeat 3 times. Complete this exercise 2 times per day.     STRENGTH - Plantar-flexors   Stand with your feet shoulder width apart. Steady yourself with a wall or table using as little support as needed.  Keeping your weight evenly spread over the width of your feet, rise up on your toes.*  Hold this position for 10 seconds. Repeat 3 times. Complete this exercise 2 times per day.  *If this is too easy, shift your weight toward your right / left leg until you feel challenged. Ultimately, you may be asked to do this exercise with your right / left foot only.  STRENGTH  Plantar-flexors, Eccentric  Note: This exercise can place a lot of stress on your foot and ankle. Please complete this exercise only if specifically instructed by your caregiver.   Place the balls of your feet on a step. With your hands, use only enough support from a wall or rail to keep your balance.  Keep your knees straight and rise up on your toes.  Slowly shift your weight entirely to your right / left toes and pick up your opposite foot. Gently and with controlled movement, lower your weight through your right / left foot so that your heel drops below the level of the step. You will feel a slight stretch in the back of your calf at the end position.  Use the healthy leg to help rise up onto the balls of both feet, then lower weight only on the right / left leg again. Build up to 15 repetitions. Then progress to 3 consecutive sets of 15 repetitions.*  After completing the above exercise, complete the same exercise with a slight knee bend (about 30 degrees). Again, build up to 15 repetitions. Then progress to 3 consecutive sets of 15 repetitions.* Perform this exercise 2 times per day.  *When you easily complete 3 sets of 15, your physician, physical therapist or athletic trainer may advise you to add resistance by wearing a backpack filled with additional weight.  STRENGTH - Plantar Flexors, Seated   Sit on a chair that allows your feet to rest flat on the ground. If  necessary, sit at the edge of the chair.  Keeping your toes firmly on the ground, lift your right / left heel as far as you can without increasing any discomfort in your ankle. Repeat 3 times. Complete this exercise 2 times a day.      Soak Instructions    THE DAY AFTER THE PROCEDURE  Place 1/4 cup of epsom salts in a quart of warm tap water.  Submerge your foot or feet with outer bandage intact for the initial soak; this will allow the bandage to become moist and wet for easy lift off.  Once you remove your bandage, continue to soak in the solution for 20 minutes.  This soak should be done twice a day.  Next, remove your foot or feet from solution, blot dry the affected area and cover.  You may use a band  aid large enough to cover the area or use gauze and tape.  Apply other medications to the area as directed by the doctor such as polysporin neosporin.  IF YOUR SKIN BECOMES IRRITATED WHILE USING THESE INSTRUCTIONS, IT IS OKAY TO SWITCH TO  WHITE VINEGAR AND WATER. Or you may use antibacterial soap and water to keep the toe clean  Monitor for any signs/symptoms of infection. Call the office immediately if any occur or go directly to the emergency room. Call with any questions/concerns.    Long Term Care Instructions-Post Nail Surgery  You have had your ingrown toenail and root treated with a chemical.  This chemical causes a burn that will drain and ooze like a blister.  This can drain for 6-8 weeks or longer.  It is important to keep this area clean, covered, and follow the soaking instructions dispensed at the time of your surgery.  This area will eventually dry and form a scab.  Once the scab forms you no longer need to soak or apply a dressing.  If at any time you experience an increase in pain, redness, swelling, or drainage, you should contact the office as soon as possible.

## 2019-11-20 NOTE — Progress Notes (Signed)
Subjective:   Patient ID: Kenneth Odom, male   DOB: 24 y.o.   MRN: [id number]   HPI Patient presents stating he has a little cyst on the fifth digit left that is nonpainful but he traumatized the toe he gets some pain in the back of the right heel that is been hurting him for around 6 months low-grade and he has chronic ingrown toenails of the big toes of both feet which have been very sore for the last month or 2 and he is getting ready to start working at 1 point in the future.  Patient does not smoke and likes to be active   Review of Systems  All other systems reviewed and are negative.       Objective:  Physical Exam Vitals and nursing note reviewed.  Constitutional:      Appearance: He is well-developed.  Pulmonary:     Effort: Pulmonary effort is normal.  Musculoskeletal:        General: Normal range of motion.  Skin:    General: Skin is warm.  Neurological:     Mental Status: He is alert.     Neurovascular status intact muscle strength adequate range of motion within normal limits.  Patient has an incurvated lateral border of the hallux bilateral that are sore when pressed and make shoe gear difficult and has a small cyst on the left fifth toe that is nontender and has some discomfort in the muscle tendon junction of the right Achilles with equinus condition noted bilateral.  Patient has good digital perfusion well oriented x3     Assessment:  Ingrown toenail deformity hallux bilateral lateral border that are painful along with small cyst fifth digit left which is probably related to the injury he had and also Achilles tendinitis right     Plan:  H&P reviewed all 3 conditions.  For the cyst it should gradually go away and I do not recommend resection currently for the Achilles we will start stretching exercises ice therapy and for the ingrown toenails I recommended correction.  They want this done I allowed him to read and signed consent form understanding risk and  today I infiltrated the right left hallux 60 mg like Marcaine mixture sterile prep to each toe and using sterile instrumentation I remove the lateral border exposed matrix and applied phenol 3 applications 30 seconds followed by alcohol lavage sterile dressing and gave instructions for soaks and to leave dressing on for 24 hours but take it off earlier if it should start to throb and I did write prescription for drops and encouraged patient to call if any questions were to arise  X-rays indicate no signs of posterior spur formation or fracture fifth digit left

## 2020-04-01 ENCOUNTER — Encounter: Payer: BC Managed Care – PPO | Admitting: Family Medicine

## 2020-04-06 DIAGNOSIS — K1370 Unspecified lesions of oral mucosa: Secondary | ICD-10-CM | POA: Diagnosis not present

## 2020-11-03 DIAGNOSIS — Z20828 Contact with and (suspected) exposure to other viral communicable diseases: Secondary | ICD-10-CM | POA: Diagnosis not present

## 2020-11-03 DIAGNOSIS — R5381 Other malaise: Secondary | ICD-10-CM | POA: Diagnosis not present

## 2020-11-03 LAB — BASIC METABOLIC PANEL
BUN: 19 (ref 4–21)
CO2: 22 (ref 13–22)
Chloride: 103 (ref 99–108)
Creatinine: 1.1 (ref 0.6–1.3)
Glucose: 88
Potassium: 4.1 (ref 3.4–5.3)
Sodium: 139 (ref 137–147)

## 2020-11-03 LAB — CBC AND DIFFERENTIAL
HCT: 45 (ref 41–53)
Hemoglobin: 15.4 (ref 13.5–17.5)
Platelets: 230 (ref 150–399)
WBC: 7.9

## 2020-11-03 LAB — CBC: RBC: 5.17 — AB (ref 3.87–5.11)

## 2020-11-03 LAB — COMPREHENSIVE METABOLIC PANEL
Albumin: 5.2 — AB (ref 3.5–5.0)
Calcium: 10.3 (ref 8.7–10.7)

## 2020-11-06 ENCOUNTER — Encounter: Payer: Self-pay | Admitting: Family Medicine

## 2020-11-06 NOTE — Telephone Encounter (Signed)
Labs abstracted 

## 2020-11-09 ENCOUNTER — Telehealth: Payer: BC Managed Care – PPO | Admitting: Family Medicine

## 2020-11-09 ENCOUNTER — Encounter: Payer: Self-pay | Admitting: Family Medicine

## 2020-11-09 VITALS — Ht 74.0 in | Wt 198.6 lb

## 2020-11-09 DIAGNOSIS — E559 Vitamin D deficiency, unspecified: Secondary | ICD-10-CM | POA: Diagnosis not present

## 2020-11-09 DIAGNOSIS — R5383 Other fatigue: Secondary | ICD-10-CM | POA: Diagnosis not present

## 2020-11-09 DIAGNOSIS — K921 Melena: Secondary | ICD-10-CM

## 2020-11-09 DIAGNOSIS — Z79899 Other long term (current) drug therapy: Secondary | ICD-10-CM | POA: Diagnosis not present

## 2020-11-09 DIAGNOSIS — R197 Diarrhea, unspecified: Secondary | ICD-10-CM

## 2020-11-09 DIAGNOSIS — R3589 Other polyuria: Secondary | ICD-10-CM

## 2020-11-09 NOTE — Progress Notes (Signed)
Phone [phone] Virtual visit via Video note   Subjective:  Chief complaint: Chief Complaint  Patient presents with   Fatigue    Starting last Monday.    Confusion   Diarrhea    2-3 months   Dizziness    Starting last Monday into the weekend.    This visit type was conducted due to national recommendations for restrictions regarding the COVID-19 Pandemic (e.g. social distancing).  This format is felt to be most appropriate for this patient at this time balancing risks to patient and risks to population by having him in for in person visit.  No physical exam was performed (except for noted visual exam or audio findings with Telehealth visits).    Our team/I connected with Henreitta Cea at  9:40 AM EDT by a video enabled telemedicine application (doxy.me or caregility through epic) and verified that I am speaking with the correct person using two identifiers.  Location patient: [place] Location provider: [facility], office Persons participating in the virtual visit:  patient  Our team/I discussed the limitations of evaluation and management by telemedicine and the availability of in person appointments. In light of current covid-19 pandemic, patient also understands that we are trying to protect them by minimizing in office contact if at all possible.  The patient expressed consent for telemedicine visit and agreed to proceed. Patient understands insurance will be billed.   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      Cholecalciferol (VITAMIN D-3 PO) Take by mouth.     Multiple Vitamins-Minerals (CENTRUM MEN) TABS Take by mouth. Gummy     No current facility-administered medications for this visit.     Objective:  Ht 6\' 2"  (1.88 m)   Wt 198 lb 10.2 oz (90.1 kg)   BMI 25.50 kg/m  self reported vitals Gen: NAD, resting comfortably, not obviously confused on exam today Lungs: nonlabored, normal respiratory rate  Skin: appears dry, no obvious rash      Assessment and Plan   # Fatigue/confusion S: Patient reports fatigue starting Monday a week ago rather significant. Tuesday felt like he was carrying a weight on his body- had some palpitations as well. Was moving some boxes at work and was really sweaty. Tested for covid with rapid on Tuesday when he went home- also did PCR test on Tuesday. He also did a repeat rapid test yesterday. Travelled this weekend and was starting to feel somewhat stronger.    Has had some palpitations, feeling of restlessness even with his anxiety. Feels more scatterbrained during this time as well/mild confusion. Felt like he was imbalanced/struggling to walk a straight line. No clear room movement/vertigo.   Did get tight chest before his flight- but states has high anxiety with flight in general. Was able to work out that week without chest pain.   He does feel like all symptoms are improving- feels more scattered and not at 100% energy. Did sleep 8 hours last night but still doesn't feel 100%. He has  been trying vitamin C. Took a b complex as well. Also mens MV vitamins.   Has been increasing fluid intake- urinates a lot.   Has noted increased hunger. Saw urgent care- states at least had CBC. CMP with slightly high calcium. TSH and T4 were normal. No palpitations over weekend thankfully  Has had digestive issues since mid to late may- on and off diarrhea since that time. No blood in stool but has had some very dark stool potential melena- has not used pepto bismol. Has had  increased diarrhea in last week.  A/P: 25 year old male with fatigue/mental fogginess over the last week with negative COVID-19 test on multiple occasions-this acts a lot like COVID.  Also having some diarrhea over the last several months that is worsened recently in the last week-we will do a GI pathogen panel.  We will also check for blood in the stool even though CBC was reassuring with possible melena-consider GI consult.  Also considered celiac panel-no family history opted to hold off for now but may consider in the future -With urinary frequency will check a urinalysis -Mild hypercalcemia on last check-we will check this again with labs, hold off on CBC for now.  Also hold off on TSH as normal within a week or so-see attachment on MyChart -Also check B12 with mental fogginess-may need more advanced testing if persistent but overall symptoms do seem to be improving -Has had some palpitations but none over the weekend-we discussed in person visit and potential EKG if symptoms fail to improve  #Vitamin D deficiency S: Medication: started taking vitamin D again last Tuesday- 5000 units daily and had been up to 10k- had been off for a while.  Last vitamin D Lab Results  Component Value Date   VD25OH 9.97 (L) 03/29/2019  A/P: Poor control in the past-we will update vitamin D with labs.  5000 units a day for now is very reasonable-may need high-dose for several weeks  Recommended follow up: We discussed reasons for return including persistent symptoms at 2 weeks or new or worsening symptoms-thankfully he is improving at this point Future Appointments  Date Time Provider Department Center  11/10/2020  8:15 AM [facility] LAB [facility]   Lab/Order associations:   ICD-10-CM   1. Fatigue, unspecified type  R53.83 Vitamin B12    VITAMIN D 25 Hydroxy (Vit-D Deficiency, Fractures)    CBC with Differential/Platelet    Comprehensive metabolic panel    POCT Urinalysis Dipstick (Automated)    2.  Vitamin D deficiency  E55.9 VITAMIN D 25 Hydroxy (Vit-D Deficiency, Fractures)    3. High risk medication use  Z79.899 Vitamin B12    4. Polyuria  R35.89 POCT Urinalysis Dipstick (Automated)    5. Diarrhea, unspecified type  R19.7 Gastrointestinal Pathogen Panel PCR    6. Melena  K92.1 Fecal occult blood, imunochemical      Return precautions advised.  Tana Conch, MD

## 2020-11-10 ENCOUNTER — Ambulatory Visit: Payer: BC Managed Care – PPO | Admitting: Physician Assistant

## 2020-11-10 ENCOUNTER — Telehealth: Payer: Self-pay

## 2020-11-10 ENCOUNTER — Other Ambulatory Visit: Payer: Self-pay

## 2020-11-10 ENCOUNTER — Other Ambulatory Visit (INDEPENDENT_AMBULATORY_CARE_PROVIDER_SITE_OTHER): Payer: BC Managed Care – PPO

## 2020-11-10 DIAGNOSIS — R3589 Other polyuria: Secondary | ICD-10-CM | POA: Diagnosis not present

## 2020-11-10 DIAGNOSIS — E559 Vitamin D deficiency, unspecified: Secondary | ICD-10-CM | POA: Diagnosis not present

## 2020-11-10 DIAGNOSIS — R197 Diarrhea, unspecified: Secondary | ICD-10-CM | POA: Diagnosis not present

## 2020-11-10 DIAGNOSIS — R5383 Other fatigue: Secondary | ICD-10-CM

## 2020-11-10 DIAGNOSIS — Z79899 Other long term (current) drug therapy: Secondary | ICD-10-CM

## 2020-11-10 DIAGNOSIS — K921 Melena: Secondary | ICD-10-CM

## 2020-11-10 LAB — COMPREHENSIVE METABOLIC PANEL
ALT: 17 U/L (ref 0–53)
AST: 18 U/L (ref 0–37)
Albumin: 5 g/dL (ref 3.5–5.2)
Alkaline Phosphatase: 44 U/L (ref 39–117)
BUN: 18 mg/dL (ref 6–23)
CO2: 26 mEq/L (ref 19–32)
Calcium: 9.9 mg/dL (ref 8.4–10.5)
Chloride: 103 mEq/L (ref 96–112)
Creatinine, Ser: 1.05 mg/dL (ref 0.40–1.50)
GFR: 100.03 mL/min (ref >60.00)
Glucose, Bld: 86 mg/dL (ref 70–99)
Potassium: 4.2 mEq/L (ref 3.5–5.1)
Sodium: 140 mEq/L (ref 135–145)
Total Bilirubin: 0.9 mg/dL (ref 0.2–1.2)
Total Protein: 7.6 g/dL (ref 6.0–8.3)

## 2020-11-10 LAB — POC URINALSYSI DIPSTICK (AUTOMATED)
Bilirubin, UA: NEGATIVE
Blood, UA: NEGATIVE
Glucose, UA: NEGATIVE
Ketones, UA: NEGATIVE
Leukocytes, UA: NEGATIVE
Nitrite, UA: NEGATIVE
Protein, UA: POSITIVE — AB
Spec Grav, UA: >=1.03 — AB (ref 1.010–1.025)
Urobilinogen, UA: 0.2 E.U./dL
pH, UA: 6 (ref 5.0–8.0)

## 2020-11-10 LAB — VITAMIN D 25 HYDROXY (VIT D DEFICIENCY, FRACTURES): VITD: 25.08 ng/mL — ABNORMAL LOW (ref 30.00–100.00)

## 2020-11-10 LAB — CBC WITH DIFFERENTIAL/PLATELET
Basophils Absolute: 0 10*3/uL (ref 0.0–0.1)
Basophils Relative: 0.6 % (ref 0.0–3.0)
Eosinophils Absolute: 0.4 10*3/uL (ref 0.0–0.7)
Eosinophils Relative: 5.5 % — ABNORMAL HIGH (ref 0.0–5.0)
HCT: 44.5 % (ref 39.0–52.0)
Hemoglobin: 15.2 g/dL (ref 13.0–17.0)
Lymphocytes Relative: 25.3 % (ref 12.0–46.0)
Lymphs Abs: 1.6 10*3/uL (ref 0.7–4.0)
MCHC: 34.1 g/dL (ref 30.0–36.0)
MCV: 86.2 fl (ref 78.0–100.0)
Monocytes Absolute: 0.6 10*3/uL (ref 0.1–1.0)
Monocytes Relative: 9.7 % (ref 3.0–12.0)
Neutro Abs: 3.8 10*3/uL (ref 1.4–7.7)
Neutrophils Relative %: 58.9 % (ref 43.0–77.0)
Platelets: 212 10*3/uL (ref 150.0–400.0)
RBC: 5.16 Mil/uL (ref 4.22–5.81)
RDW: 13 % (ref 11.5–15.5)
WBC: 6.4 10*3/uL (ref 4.0–10.5)

## 2020-11-10 LAB — VITAMIN B12: Vitamin B-12: 390 pg/mL (ref 211–911)

## 2020-11-10 NOTE — Telephone Encounter (Signed)
Patient's mother is calling in stating that she has a question about fecal test and is wondering if someone is able to give her a call back.

## 2020-11-11 NOTE — Telephone Encounter (Signed)
Can you help answer this please?

## 2020-11-11 NOTE — Telephone Encounter (Signed)
I called the number of pt mom and that was a recording about receiving a voucher and a credit card. Unable to reach pt mom to answer question. I will send a mychart.

## 2020-11-12 NOTE — Telephone Encounter (Signed)
We can start with a GI pathogen panel and then he may just need to find a way to bring the stool cards back if we do not find a cause with that

## 2020-11-12 NOTE — Telephone Encounter (Signed)
Left message on voicemail to call office.  

## 2020-11-12 NOTE — Telephone Encounter (Signed)
Dr. Hunter, please see message. ?

## 2020-11-12 NOTE — Telephone Encounter (Signed)
Pt called back said he is in [place] and will not be able to return the Fecal cards till he is in town next time. Told pt I will let Dr. Durene Cal know.

## 2020-11-13 LAB — GASTROINTESTINAL PATHOGEN PANEL PCR
C. difficile Tox A/B, PCR: NOT DETECTED
Campylobacter, PCR: NOT DETECTED
Cryptosporidium, PCR: NOT DETECTED
E coli (ETEC) LT/ST PCR: NOT DETECTED
E coli (STEC) stx1/stx2, PCR: NOT DETECTED
E coli 0157, PCR: NOT DETECTED
Giardia lamblia, PCR: NOT DETECTED
Norovirus, PCR: NOT DETECTED
Rotavirus A, PCR: NOT DETECTED
Salmonella, PCR: NOT DETECTED
Shigella, PCR: NOT DETECTED

## 2020-11-13 LAB — TIQ-NTM

## 2020-11-13 NOTE — Telephone Encounter (Signed)
Noted  

## 2020-11-23 DIAGNOSIS — K1379 Other lesions of oral mucosa: Secondary | ICD-10-CM | POA: Diagnosis not present

## 2021-01-04 DIAGNOSIS — J342 Deviated nasal septum: Secondary | ICD-10-CM | POA: Diagnosis not present

## 2021-01-04 DIAGNOSIS — Z8709 Personal history of other diseases of the respiratory system: Secondary | ICD-10-CM | POA: Diagnosis not present

## 2021-01-04 DIAGNOSIS — D109 Benign neoplasm of pharynx, unspecified: Secondary | ICD-10-CM | POA: Diagnosis not present

## 2021-01-25 DIAGNOSIS — J329 Chronic sinusitis, unspecified: Secondary | ICD-10-CM | POA: Diagnosis not present

## 2021-01-25 DIAGNOSIS — J342 Deviated nasal septum: Secondary | ICD-10-CM | POA: Diagnosis not present

## 2021-01-25 DIAGNOSIS — D1030 Benign neoplasm of unspecified part of mouth: Secondary | ICD-10-CM | POA: Diagnosis not present

## 2021-01-25 DIAGNOSIS — D104 Benign neoplasm of tonsil: Secondary | ICD-10-CM | POA: Diagnosis not present

## 2021-02-17 DIAGNOSIS — L720 Epidermal cyst: Secondary | ICD-10-CM | POA: Diagnosis not present

## 2021-02-18 DIAGNOSIS — D104 Benign neoplasm of tonsil: Secondary | ICD-10-CM | POA: Diagnosis not present

## 2021-02-18 DIAGNOSIS — D109 Benign neoplasm of pharynx, unspecified: Secondary | ICD-10-CM | POA: Diagnosis not present

## 2021-02-18 DIAGNOSIS — J342 Deviated nasal septum: Secondary | ICD-10-CM | POA: Diagnosis not present

## 2021-02-18 DIAGNOSIS — J329 Chronic sinusitis, unspecified: Secondary | ICD-10-CM | POA: Diagnosis not present

## 2022-01-03 ENCOUNTER — Encounter: Payer: Self-pay | Admitting: *Deleted

## 2022-03-24 ENCOUNTER — Encounter: Payer: Self-pay | Admitting: *Deleted

## 2022-08-15 ENCOUNTER — Telehealth: Payer: Self-pay | Admitting: Family Medicine

## 2022-08-15 NOTE — Telephone Encounter (Signed)
3 attempts made to contact pt. No answer  Patient Name First: Kenneth Last: Odom Gender: Unknown DOB: 05-12-1995 Age: 27 Y 3 M 16 D Return Phone Number: [phone] (Primary) Address: City/ State/ Zip: [place]  [address] Client [facility] [employer] Provider Tana Conch- MD Contact Type Call Who Is Calling Patient / Member / Family / Caregiver Call Type Triage / Clinical Caller Name Marvis Repress Relationship To Patient Mother Return Phone Number [phone] (Primary) Chief Complaint Shoulder Injury Reason for Call Symptomatic / Request for Health Information Initial Comment Caller states she her son has a shoulder history and believes he has injured it again. Translation No Disp. Time Lamount Cohen Time) Disposition Final User 08/13/2022 9:34:25 AM Attempt made - message left Mat Carne 08/13/2022 9:46:12 AM FINAL ATTEMPT MADE - message left Yes Valentina Lucks RN, Tempie Hoist Final Disposition 08/13/2022 9:46:12 AM FINAL ATTEMPT MADE - message left Yes Valentina Lucks, RN, Tempie Hoist

## 2024-04-26 ENCOUNTER — Institutional Professional Consult (permissible substitution) (INDEPENDENT_AMBULATORY_CARE_PROVIDER_SITE_OTHER): Admitting: Otolaryngology

## 2024-05-16 ENCOUNTER — Encounter (INDEPENDENT_AMBULATORY_CARE_PROVIDER_SITE_OTHER): Payer: Self-pay | Admitting: Otolaryngology

## 2024-05-16 ENCOUNTER — Ambulatory Visit (INDEPENDENT_AMBULATORY_CARE_PROVIDER_SITE_OTHER): Admitting: Otolaryngology

## 2024-05-16 VITALS — BP 133/78 | HR 74 | Ht 74.0 in | Wt 207.0 lb

## 2024-05-16 DIAGNOSIS — J342 Deviated nasal septum: Secondary | ICD-10-CM | POA: Insufficient documentation

## 2024-05-16 DIAGNOSIS — J310 Chronic rhinitis: Secondary | ICD-10-CM

## 2024-05-16 DIAGNOSIS — G4733 Obstructive sleep apnea (adult) (pediatric): Secondary | ICD-10-CM | POA: Insufficient documentation

## 2024-05-16 DIAGNOSIS — J343 Hypertrophy of nasal turbinates: Secondary | ICD-10-CM | POA: Insufficient documentation

## 2024-05-16 MED ORDER — FLUTICASONE PROPIONATE 50 MCG/ACT NA SUSP: 2.0000 | Freq: Every day | NASAL | 10 refills | Status: AC
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
## 2024-06-28 ENCOUNTER — Ambulatory Visit (INDEPENDENT_AMBULATORY_CARE_PROVIDER_SITE_OTHER): Admitting: Otolaryngology
